# Patient Record
Sex: Male | Born: 1990 | Race: White | Hispanic: No | Marital: Married | Smoking: Former smoker
Health system: Southern US, Community
[De-identification: ages and names within clinical notes are randomized; demographics above are authoritative.]

---

## 2014-10-06 ENCOUNTER — Encounter: Payer: Self-pay | Admitting: *Deleted

## 2014-10-06 ENCOUNTER — Emergency Department (INDEPENDENT_AMBULATORY_CARE_PROVIDER_SITE_OTHER)
Admission: EM | Admit: 2014-10-06 | Discharge: 2014-10-06 | Disposition: A | Discharge status: Home / Self Care | Payer: Worker's Compensation | Source: Home / Self Care | Attending: Family Medicine | Admitting: Family Medicine

## 2014-10-06 DIAGNOSIS — S39012A Strain of muscle, fascia and tendon of lower back, initial encounter: Secondary | ICD-10-CM | POA: Diagnosis not present

## 2014-10-06 MED ORDER — METHOCARBAMOL 500 MG PO TABS: 500.0000 mg | ORAL_TABLET | Freq: Two times a day (BID) | ORAL | Status: DC

## 2014-10-06 MED ORDER — MELOXICAM 7.5 MG PO TABS: 7.5000 mg | ORAL_TABLET | Freq: Every day | ORAL | Status: DC

## 2014-10-06 NOTE — ED Provider Notes (Signed)
CSN: [id number]     Arrival date & time 10/06/14  0920 History   First MD Initiated Contact with Patient 10/06/14 [phone]     Chief Complaint  Patient presents with  . Back Pain   (Consider location/radiation/quality/duration/timing/severity/associated sxs/prior Treatment) HPI  Pt is a 24yo male presenting to [facility] with c/o bilateral lower back pain that started yesterday after pt bent down to pick up a case of Gatorade. Pt states he felt a pinch in his lower back. Pain is aching and sharp on occasion with certain movements.  Pain is 8/10 at worst. No relief with ibuprofen. Denies change in bowel or bladder habits. Denies numbness or tingling in arms or legs. Denies hx of prior back surgeries or injuries. Denies any other injuries.  History reviewed. No pertinent past medical history. History reviewed. No pertinent past surgical history. History reviewed. No pertinent family history. Social History  Substance Use Topics  . Smoking status: Former Games developer  . Smokeless tobacco: Current User  . Alcohol Use: Yes    Review of Systems  Constitutional: Negative for fever and chills.  Respiratory: Negative for cough and shortness of breath.   Cardiovascular: Negative for chest pain and palpitations.  Gastrointestinal: Negative for nausea, vomiting, abdominal pain and diarrhea.  Musculoskeletal: Positive for myalgias and back pain. Negative for joint swelling, arthralgias, gait problem, neck pain and neck stiffness.  Skin: Negative for color change and wound.  Neurological: Negative for weakness and numbness.     Allergies  Review of patient's allergies indicates no known allergies.  Home Medications   Prior to Admission medications   Medication Sig Start Date End Date Taking? Authorizing Provider  meloxicam (MOBIC) 7.5 MG tablet Take 1 tablet (7.5 mg total) by mouth daily. 10/06/14   Junius Finner, PA-C  methocarbamol (ROBAXIN) 500 MG tablet Take 1 tablet (500 mg total) by mouth 2 (two) times  daily. 10/06/14   Junius Finner, PA-C   Meds Ordered and Administered this Visit  Medications - No data to display  BP 128/74 mmHg  Pulse 72  Temp(Src) 97.6 F (36.4 C) (Oral)  Resp 16  Ht 5\' 10"  (1.778 m)  Wt 203 lb (92.08 kg)  BMI 29.13 kg/m2  SpO2 99% No data found.   Physical Exam  Constitutional: He is oriented to person, place, and time. He appears well-developed and well-nourished.  HENT:  Head: Normocephalic and atraumatic.  Eyes: EOM are normal.  Neck: Normal range of motion.  Cardiovascular: Normal rate.   Pulmonary/Chest: Effort normal.  Musculoskeletal: Normal range of motion. He exhibits tenderness. He exhibits no edema.  Mild tenderness over SI joint bilaterally. Increased pain with full rotation and flexion at the hip.  FROM upper and lower extremities bilaterally with 5/5 strength. Negative straight leg raise Normal gait.  Neurological: He is alert and oriented to person, place, and time. He has normal strength. No sensory deficit. Gait normal.  Reflex Scores:      Patellar reflexes are 2+ on the right side and 2+ on the left side. Skin: Skin is warm and dry.  Psychiatric: He has a normal mood and affect. His behavior is normal.  Nursing note and vitals reviewed.    ED Course  Procedures (including critical care time)  Labs Review Labs Reviewed - No data to display  Imaging Review No results found.     MDM   1. Low back strain, initial encounter     Pt c/o lower back pain after bending down to lift a  pack of Gatorade yesterday. No red flag symptoms. Pain reproducible with palpation.  Do not believe imaging needed at this time. Not concerned for emergent process taking place. Will tx symptomatically as needed for pain.   Worker's Comp Information   Return To Work: 10/07/14- light duty, return to full duty by 10/11/14 if cleared by employee health   Work Restrictions: no heavy lifting >25, no bending, twisting, kneeling, climbing.   Referral (if  indicated):  MAKE FOLLOW-UP APPOINTMENT: 10/11/14 employee health  [facility]  At [facility] [phone] [address]  [place], [place] [address]     Junius Finner, [place] 10/06/14 1058

## 2014-10-06 NOTE — ED Notes (Signed)
Frederick Wood reports " I bent down to pick up a case of Gatorade and felt a pinch in my lower back" yesterday. Took IBF and applied heat last night. Pain is worse this morning.

## 2014-10-11 ENCOUNTER — Emergency Department (INDEPENDENT_AMBULATORY_CARE_PROVIDER_SITE_OTHER)
Admission: EM | Admit: 2014-10-11 | Discharge: 2014-10-11 | Disposition: A | Discharge status: Home / Self Care | Payer: Worker's Compensation | Source: Home / Self Care | Attending: Family Medicine | Admitting: Family Medicine

## 2014-10-11 ENCOUNTER — Encounter: Payer: Self-pay | Admitting: Emergency Medicine

## 2014-10-11 DIAGNOSIS — S39012D Strain of muscle, fascia and tendon of lower back, subsequent encounter: Secondary | ICD-10-CM

## 2014-10-11 NOTE — ED Notes (Signed)
Pt here for f/u for low back pain, work related injury from 10/06/14.  Pt is no better, still having low back pain.

## 2014-10-11 NOTE — ED Provider Notes (Signed)
CSN: [id number]     Arrival date & time 10/11/14  0915 History   First MD Initiated Contact with Patient 10/11/14 0920     No chief complaint on file.  (Consider location/radiation/quality/duration/timing/severity/associated sxs/prior Treatment) HPI Pt presenting to [facility] for recheck of lower back pain that started on 10/06/14 while at work lifting a case of Gatorade. Pt states he has had minimal improvement.  Pain is now on Right lower side rather than all the way across his back. Pain is aching and sore, 5/10 at worst. Worse with certain movements and palpation. Denies fever, chills, n/v/d. Denies pain radiating into legs or arms. No change in bowel or bladder habits. He has been taking meloxicam and robaxin with moderate temporary relief.  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  History reviewed. No pertinent past medical history. History reviewed. No pertinent past surgical history. No family history on file. Social History  Substance Use Topics  . Smoking status: Former Games developer  . Smokeless tobacco: Current User  . Alcohol Use: Yes    Review of Systems  Constitutional: Negative for fever and chills.  Genitourinary: Negative for dysuria, hematuria and flank pain.  Musculoskeletal: Positive for back pain ( Lower). Negative for myalgias, joint swelling, arthralgias and gait problem.  Skin: Negative for rash.  Neurological: Negative for weakness and numbness.    Allergies  Review of patient's allergies indicates no known allergies.  Home Medications   Prior to Admission medications   Medication Sig Start Date End Date Taking? Authorizing Provider  meloxicam (MOBIC) 7.5 MG tablet Take 1 tablet (7.5 mg total) by mouth daily. 10/06/14   Junius Finner, PA-C  methocarbamol (ROBAXIN) 500 MG tablet Take 1 tablet (500 mg total) by mouth 2 (two) times daily. 10/06/14   Junius Finner, PA-C   Meds Ordered and Administered this Visit  Medications - No data to display  BP 127/73 mmHg  Pulse 77  Temp(Src) 98.3 F (36.8 C) (Oral)  Ht  (1.778 m)  Wt 203 lb (92.08 kg)  BMI 29.13 kg/m2  SpO2 96% No data found.   Physical Exam  Constitutional: He is oriented to person, place, and time. He appears well-developed and well-nourished.  HENT:  Head: Normocephalic and atraumatic.  Eyes: EOM are normal.  Neck: Normal range of motion. Neck supple.  No midline bone tenderness, no crepitus or step-offs.    Cardiovascular: Normal rate.   Pulmonary/Chest: Effort normal.  Musculoskeletal: Normal range of motion. He exhibits tenderness.  No midline spinal tenderness. Tenderness to Right lumbar muscles. Increased pain with squatting, bending at wait and rotation at waist.  Negative straight leg raise 5/5 strength in upper and lower extremities bilaterally   Neurological: He is alert and oriented to person, place, and time.  Normal gait  Skin: Skin is warm and dry.  Psychiatric: He has a normal mood and affect. His behavior is normal.  Nursing note and vitals reviewed.   ED Course  Procedures (including critical care time)  Labs Review Labs Reviewed - No data to display  Imaging Review No results found.     MDM   1. Low back strain, subsequent encounter     Pt presenting back to [facility] for recheck of lower back strain that occurred on 10/06/14 at work. No new symptoms. Symptoms have slightly improved. No red flag symptoms.  Worker's Comp Information   Return To Work: 10/12/14, light duty   Work Restrictions: no lifting >25lbs, limited walking, standing and bending Allow pt to ice, rest, and gentle stretching as tolerated  Referral (if indicated):  MAKE FOLLOW-UP APPOINTMENT: Employee Health 10/14/14 if not improving  [facility]  At [facility] [phone] [address]  [place], [place] [address]      Junius Finner, PA-C 10/11/14 1018

## 2014-10-29 ENCOUNTER — Emergency Department (INDEPENDENT_AMBULATORY_CARE_PROVIDER_SITE_OTHER)
Admission: EM | Admit: 2014-10-29 | Discharge: 2014-10-29 | Disposition: A | Discharge status: Home / Self Care | Payer: Worker's Compensation | Source: Home / Self Care | Attending: Family Medicine | Admitting: Family Medicine

## 2014-10-29 ENCOUNTER — Encounter: Payer: Self-pay | Admitting: *Deleted

## 2014-10-29 DIAGNOSIS — M545 Low back pain, unspecified: Secondary | ICD-10-CM

## 2014-10-29 NOTE — ED Notes (Signed)
Frederick Wood is here today for W/C follow- up from his initial visit on 10/06/2014. Denies pain.

## 2014-10-29 NOTE — ED Provider Notes (Signed)
CSN: [id number]     Arrival date & time 10/29/14  1405 History   First MD Initiated Contact with Patient 10/29/14 1415     Chief Complaint  Patient presents with  . Back Pain   (Consider location/radiation/quality/duration/timing/severity/associated sxs/prior Treatment) HPI Pt is a 24yo male presenting to [facility] for recheck of lower back injury while at work on 10/06/14.  Pt states he is ready to go back to work without restrictions.  He has been alternating ice and heat as well as performing home exercises for back pain. Denies back pain at this time. Denies pain, numbness or weakness in arms or legs.  History reviewed. No pertinent past medical history. History reviewed. No pertinent past surgical history. History reviewed. No pertinent family history. Social History  Substance Use Topics  . Smoking status: Former Games developer  . Smokeless tobacco: Current User  . Alcohol Use: Yes    Review of Systems  Musculoskeletal: Negative for myalgias, arthralgias, neck pain and neck stiffness.  Skin: Negative for color change and wound.  Neurological: Negative for weakness and numbness.    Allergies  Review of patient's allergies indicates no known allergies.  Home Medications   Prior to Admission medications   Not on File   Meds Ordered and Administered this Visit  Medications - No data to display  BP 129/79 mmHg  Pulse 84  Temp(Src) 98.5 F (36.9 C) (Oral)  Resp 16  Ht  (1.778 m)  Wt 205 lb (92.987 kg)  BMI 29.41 kg/m2  SpO2 97% No data found.   Physical Exam  Constitutional: He is oriented to person, place, and time. He appears well-developed and well-nourished.  HENT:  Head: Normocephalic and atraumatic.  Eyes: EOM are normal.  Neck: Normal range of motion.  Cardiovascular: Normal rate.   Pulmonary/Chest: Effort normal.  Musculoskeletal: Normal range of motion. He exhibits no edema or tenderness.  No midline spinal tenderness. FROM upper and lower extremities with  5/5 strength bilaterally. Able to bend and rotate at waist w/o pain Able to squat without difficulty  Neurological: He is alert and oriented to person, place, and time.  Normal gait. Normal sensations in all extremities bilaterally   Skin: Skin is warm and dry.  Psychiatric: He has a normal mood and affect. His behavior is normal.  Nursing note and vitals reviewed.   ED Course  Procedures (including critical care time)  Labs Review Labs Reviewed - No data to display  Imaging Review No results found.    MDM   1. Bilateral low back pain without sciatica    Pt cleared to return to full duty at work, no restrictions.  Follow up with primary care or employee health as needed.    Junius Finner, PA-C 10/29/14 1431

## 2014-10-29 NOTE — Discharge Instructions (Signed)
Follow up with primary care and employee health as needed

## 2014-11-15 ENCOUNTER — Ambulatory Visit (INDEPENDENT_AMBULATORY_CARE_PROVIDER_SITE_OTHER): Payer: Self-pay

## 2014-11-15 ENCOUNTER — Other Ambulatory Visit: Payer: Self-pay | Admitting: Emergency Medicine

## 2014-11-15 DIAGNOSIS — R52 Pain, unspecified: Secondary | ICD-10-CM

## 2014-11-15 DIAGNOSIS — M25571 Pain in right ankle and joints of right foot: Secondary | ICD-10-CM

## 2015-06-29 ENCOUNTER — Emergency Department (INDEPENDENT_AMBULATORY_CARE_PROVIDER_SITE_OTHER)
Admission: EM | Admit: 2015-06-29 | Discharge: 2015-06-29 | Disposition: A | Discharge status: Home / Self Care | Payer: Worker's Compensation | Source: Home / Self Care | Attending: Family Medicine | Admitting: Family Medicine

## 2015-06-29 ENCOUNTER — Encounter: Payer: Self-pay | Admitting: *Deleted

## 2015-06-29 DIAGNOSIS — S39012A Strain of muscle, fascia and tendon of lower back, initial encounter: Secondary | ICD-10-CM

## 2015-06-29 DIAGNOSIS — M545 Low back pain, unspecified: Secondary | ICD-10-CM

## 2015-06-29 MED ORDER — PREDNISONE 20 MG PO TABS: ORAL_TABLET | ORAL | Status: DC

## 2015-06-29 MED ORDER — MELOXICAM 7.5 MG PO TABS: 7.5000 mg | ORAL_TABLET | Freq: Every day | ORAL | Status: DC

## 2015-06-29 MED ORDER — CYCLOBENZAPRINE HCL 10 MG PO TABS: 10.0000 mg | ORAL_TABLET | Freq: Two times a day (BID) | ORAL | Status: DC | PRN

## 2015-06-29 NOTE — Discharge Instructions (Signed)
Flexeril is a muscle relaxer and may cause drowsiness. Do not drink alcohol, drive, or operate heavy machinery while taking.  Meloxicam (Mobic) is an antiinflammatory to help with pain and inflammation.  Do not take ibuprofen, Advil, Aleve, or any other medications that contain NSAIDs while taking meloxicam as this may cause stomach upset or even ulcers if taken in large amounts for an extended period of time.    Back Injury Prevention Back injuries can be very painful. They can also be difficult to heal. After having one back injury, you are more likely to injure your back again. It is important to learn how to avoid injuring or re-injuring your back. The following tips can help you to prevent a back injury. WHAT SHOULD I KNOW ABOUT PHYSICAL FITNESS? 1. Exercise for 30 minutes per day on most days of the week or as directed by your health care provider. Make sure to: 1. Do aerobic exercises, such as walking, jogging, biking, or swimming. 2. Do exercises that increase balance and strength, such as tai chi and yoga. These can decrease your risk of falling and injuring your back. 3. Do stretching exercises to help with flexibility. 4. Try to develop strong abdominal muscles. Your abdominal muscles provide a lot of the support that is needed by your back. 2. Maintain a healthy weight. This helps to decrease your risk of a back injury. WHAT SHOULD I KNOW ABOUT MY DIET? 1. Talk with your health care provider about your overall diet. Take supplements and vitamins only as directed by your health care provider. 2. Talk with your health care provider about how much calcium and vitamin D you need each day. These nutrients help to prevent weakening of the bones (osteoporosis). Osteoporosis can cause broken (fractured) bones, which lead to back pain. 3. Include good sources of calcium in your diet, such as dairy products, green leafy vegetables, and products that have had calcium added to them  (fortified). 4. Include good sources of vitamin D in your diet, such as milk and foods that are fortified with vitamin D. WHAT SHOULD I KNOW ABOUT MY POSTURE? 1. Sit up straight and stand up straight. Avoid leaning forward when you sit or hunching over when you stand. 2. Choose chairs that have good low-back (lumbar) support. 3. If you work at a desk, sit close to it so you do not need to lean over. Keep your chin tucked in. Keep your neck drawn back, and keep your elbows bent at a right angle. Your arms should look like the letter "L." 4. Sit high and close to the steering wheel when you drive. Add a lumbar support to your car seat, if needed. 5. Avoid sitting or standing in one position for very long. Take breaks to get up, stretch, and walk around at least one time every hour. Take breaks every hour if you are driving for long periods of time. 6. Sleep on your side with your knees slightly bent, or sleep on your back with a pillow under your knees. Do not lie on the front of your body to sleep. WHAT SHOULD I KNOW ABOUT LIFTING, TWISTING, AND REACHING? Lifting and Heavy Lifting 1. Avoid heavy lifting, especially repetitive heavy lifting. If you must do heavy lifting: 1. Stretch before lifting. 2. Work slowly. 3. Rest between lifts. 4. Use a tool such as a cart or a dolly to move objects if one is available. 5. Make several small trips instead of carrying one heavy load. 6. Ask for  help when you need it, especially when moving big objects. 2. Follow these steps when lifting: 1. Stand with your feet shoulder-width apart. 2. Get as close to the object as you can. Do not try to pick up a heavy object that is far from your body. 3. Use handles or lifting straps if they are available. 4. Bend at your knees. Squat down, but keep your heels off the floor. 5. Keep your shoulders pulled back, your chin tucked in, and your back straight. 6. Lift the object slowly while you tighten the muscles in your  legs, abdomen, and buttocks. Keep the object as close to the center of your body as possible. 3. Follow these steps when putting down a heavy load: 1. Stand with your feet shoulder-width apart. 2. Lower the object slowly while you tighten the muscles in your legs, abdomen, and buttocks. Keep the object as close to the center of your body as possible. 3. Keep your shoulders pulled back, your chin tucked in, and your back straight. 4. Bend at your knees. Squat down, but keep your heels off the floor. 5. Use handles or lifting straps if they are available. Twisting and Reaching 1. Avoid lifting heavy objects above your waist. 2. Do not twist at your waist while you are lifting or carrying a load. If you need to turn, move your feet. 3. Do not bend over without bending at your knees. 4. Avoid reaching over your head, across a table, or for an object on a high surface. WHAT ARE SOME OTHER TIPS? 1. Avoid wet floors and icy ground. Keep sidewalks clear of ice to prevent falls. 2. Do not sleep on a mattress that is too soft or too hard. 3. Keep items that are used frequently within easy reach. 4. Put heavier objects on shelves at waist level, and put lighter objects on lower or higher shelves. 5. Find ways to decrease your stress, such as exercise, massage, or relaxation techniques. Stress can build up in your muscles. Tense muscles are more vulnerable to injury. 6. Talk with your health care provider if you feel anxious or depressed. These conditions can make back pain worse. 7. Wear flat heel shoes with cushioned soles. 8. Avoid sudden movements. 9. Use both shoulder straps when carrying a backpack. 10. Do not use any tobacco products, including cigarettes, chewing tobacco, or electronic cigarettes. If you need help quitting, ask your health care provider.   This information is not intended to replace advice given to you by your health care provider. Make sure you discuss any questions you have  with your health care provider.   Document Released: 03/01/2004 Document Revised: 06/08/2014 Document Reviewed: 01/26/2014 Elsevier Interactive Patient Education 2016 [employer]  Back Pain, Adult Back pain is very common. The pain often gets better over time. The cause of back pain is usually not dangerous. Most people can learn to manage their back pain on their own.  HOME CARE  Watch your back pain for any changes. The following actions may help to lessen any pain you are feeling: 3. Stay active. Start with short walks on flat ground if you can. Try to walk farther each day. 4. Exercise regularly as told by your doctor. Exercise helps your back heal faster. It also helps avoid future injury by keeping your muscles strong and flexible. 5. Do not sit, drive, or stand in one place for more than 30 minutes. 6. Do not stay in bed. Resting more than 1-2 days can slow  down your recovery. 7. Be careful when you bend or lift an object. Use good form when lifting: 1. Bend at your knees. 2. Keep the object close to your body. 3. Do not twist. 8. Sleep on a firm mattress. Lie on your side, and bend your knees. If you lie on your back, put a pillow under your knees. 9. Take medicines only as told by your doctor. 10. Put ice on the injured area. 1. Put ice in a plastic bag. 2. Place a towel between your skin and the bag. 3. Leave the ice on for 20 minutes, 2-3 times a day for the first 2-3 days. After that, you can switch between ice and heat packs. 11. Avoid feeling anxious or stressed. Find good ways to deal with stress, such as exercise. 12. Maintain a healthy weight. Extra weight puts stress on your back. GET HELP IF:  5. You have pain that does not go away with rest or medicine. 6. You have worsening pain that goes down into your legs or buttocks. 7. You have pain that does not get better in one week. 8. You have pain at night. 9. You lose weight. 10. You have a fever or chills. GET HELP  RIGHT AWAY IF:  7. You cannot control when you poop (bowel movement) or pee (urinate). 8. Your arms or legs feel weak. 9. Your arms or legs lose feeling (numbness). 10. You feel sick to your stomach (nauseous) or throw up (vomit). 11. You have belly (abdominal) pain. 12. You feel like you may pass out (faint).   This information is not intended to replace advice given to you by your health care provider. Make sure you discuss any questions you have with your health care provider.   Document Released: 07/11/2007 Document Revised: 02/12/2014 Document Reviewed: 05/26/2013 Elsevier Interactive Patient Education 2016 [employer].  Back Exercises If you have pain in your back, do these exercises 2-3 times each day or as told by your doctor. When the pain goes away, do the exercises once each day, but repeat the steps more times for each exercise (do more repetitions). If you do not have pain in your back, do these exercises once each day or as told by your doctor. EXERCISES Single Knee to Chest Do these steps 3-5 times in a row for each leg: 13. Lie on your back on a firm bed or the floor with your legs stretched out. 24. Bring one knee to your chest. 15. Hold your knee to your chest by grabbing your knee or thigh. 16. Pull on your knee until you feel a gentle stretch in your lower back. 17. Keep doing the stretch for 10-30 seconds. 18. Slowly let go of your leg and straighten it. Pelvic Tilt Do these steps 5-10 times in a row: 11. Lie on your back on a firm bed or the floor with your legs stretched out. 12. Bend your knees so they point up to the ceiling. Your feet should be flat on the floor. 13. Tighten your lower belly (abdomen) muscles to press your lower back against the floor. This will make your tailbone point up to the ceiling instead of pointing down to your feet or the floor. 14. Stay in this position for 5-10 seconds while you gently tighten your muscles and breathe  evenly. Cat-Cow Do these steps until your lower back bends more easily: 13. Get on your hands and knees on a firm surface. Keep your hands under your shoulders, and keep  your knees under your hips. You may put padding under your knees. 14. Let your head hang down, and make your tailbone point down to the floor so your lower back is round like the back of a cat. 15. Stay in this position for 5 seconds. 16. Slowly lift your head and make your tailbone point up to the ceiling so your back hangs low (sags) like the back of a cow. 17. Stay in this position for 5 seconds. Press-Ups Do these steps 5-10 times in a row: 4. Lie on your belly (face-down) on the floor. 5. Place your hands near your head, about shoulder-width apart. 6. While you keep your back relaxed and keep your hips on the floor, slowly straighten your arms to raise the top half of your body and lift your shoulders. Do not use your back muscles. To make yourself more comfortable, you may change where you place your hands. 7. Stay in this position for 5 seconds. 8. Slowly return to lying flat on the floor. Bridges Do these steps 10 times in a row: 5. Lie on your back on a firm surface. 6. Bend your knees so they point up to the ceiling. Your feet should be flat on the floor. 7. Tighten your butt muscles and lift your butt off of the floor until your waist is almost as high as your knees. If you do not feel the muscles working in your butt and the back of your thighs, slide your feet 1-2 inches farther away from your butt. 8. Stay in this position for 3-5 seconds. 9. Slowly lower your butt to the floor, and let your butt muscles relax. If this exercise is too easy, try doing it with your arms crossed over your chest. Belly Crunches Do these steps 5-10 times in a row: 11. Lie on your back on a firm bed or the floor with your legs stretched out. 12. Bend your knees so they point up to the ceiling. Your feet should be flat on the  floor. 34. Cross your arms over your chest. 14. Tip your chin a little bit toward your chest but do not bend your neck. 58. Tighten your belly muscles and slowly raise your chest just enough to lift your shoulder blades a tiny bit off of the floor. 16. Slowly lower your chest and your head to the floor. Back Lifts Do these steps 5-10 times in a row: 1. Lie on your belly (face-down) with your arms at your sides, and rest your forehead on the floor. 2. Tighten the muscles in your legs and your butt. 3. Slowly lift your chest off of the floor while you keep your hips on the floor. Keep the back of your head in line with the curve in your back. Look at the floor while you do this. 4. Stay in this position for 3-5 seconds. 5. Slowly lower your chest and your face to the floor. GET HELP IF:  Your back pain gets a lot worse when you do an exercise.  Your back pain does not lessen 2 hours after you exercise. If you have any of these problems, stop doing the exercises. Do not do them again unless your doctor says it is okay. GET HELP RIGHT AWAY IF:  You have sudden, very bad back pain. If this happens, stop doing the exercises. Do not do them again unless your doctor says it is okay.   This information is not intended to replace advice given to you by your  health care provider. Make sure you discuss any questions you have with your health care provider.   Document Released: 02/24/2010 Document Revised: 10/13/2014 Document Reviewed: 03/18/2014 Elsevier Interactive Patient Education [employer].

## 2015-06-29 NOTE — ED Notes (Signed)
Frederick Wood is here today for work related injury. he c/o LT lower back pain x 0800 after bending over to pick up a pallet. No OTC meds.

## 2015-06-29 NOTE — ED Provider Notes (Signed)
CSN: [id number]     Arrival date & time 06/29/15  1121 History   First MD Initiated Contact with Patient 06/29/15 1132     Chief Complaint  Patient presents with  . Back Pain   (Consider location/radiation/quality/duration/timing/severity/associated sxs/prior Treatment) HPI  The pt is a 25yo male presenting to [facility] with c/o a work related back injury from earlier this morning while working at [employer] . Pt notes he bent over trying to pick up a pallet. He pulled on a broken part of the product and felt immediate pain in his left lower back. Pain is 5/10 at this time. Pain is aching and sore. Worse with certain movements. He notes he has had back issues for several years due to riding dirt bikes and working at [employer] prior to working for [employer] .  Pain is worsened more frequently over the last few months. No pain medication PTA. He does not wear a back brace while working. Denies hx of prior back or neck surgeries. Denies numbness or weakness in arms or legs. No change in bowel or bladder habits.   History reviewed. No pertinent past medical history. History reviewed. No pertinent past surgical history. History reviewed. No pertinent family history. Social History  Substance Use Topics  . Smoking status: Former Games developer  . Smokeless tobacco: Current User  . Alcohol Use: Yes    Review of Systems  Musculoskeletal: Positive for myalgias and back pain. Negative for joint swelling, arthralgias, gait problem, neck pain and neck stiffness.  Skin: Negative for color change and wound.  Neurological: Negative for weakness and numbness.    Allergies  Review of patient's allergies indicates no known allergies.  Home Medications   Prior to Admission medications   Medication Sig Start Date End Date Taking? Authorizing Provider  cyclobenzaprine (FLEXERIL) 10 MG tablet Take 1 tablet (10 mg total) by mouth 2 (two) times daily as needed for muscle spasms. 06/29/15   Junius Finner, PA-C  meloxicam (MOBIC) 7.5 MG  tablet Take 1-2 tablets (7.5-15 mg total) by mouth daily. 06/29/15   Junius Finner, PA-C  predniSONE (DELTASONE) 20 MG tablet 3 tabs po day one, then 2 po daily x 4 days 06/29/15   Junius Finner, PA-C   Meds Ordered and Administered this Visit  Medications - No data to display  BP 135/78 mmHg  Pulse 74  Temp(Src) 97.9 F (36.6 C) (Oral)  Resp 16  Ht  (1.778 m)  Wt 206 lb (93.441 kg)  BMI 29.56 kg/m2  SpO2 98% No data found.   Physical Exam  Constitutional: He is oriented to person, place, and time. He appears well-developed and well-nourished.  HENT:  Head: Normocephalic and atraumatic.  Eyes: EOM are normal.  Neck: Normal range of motion.  Cardiovascular: Normal rate, regular rhythm and normal heart sounds.   Pulmonary/Chest: Effort normal and breath sounds normal. No respiratory distress. He has no wheezes. He has no rales.  Musculoskeletal: Normal range of motion. He exhibits tenderness.  No midline spinal tenderness. Tenderness to Left lower lumbar muscles. Full ROM upper and lower extremities bilaterally. Negative straight leg raise.  Neurological: He is alert and oriented to person, place, and time.  Reflex Scores:      Patellar reflexes are 2+ on the right side and 2+ on the left side. Normal gait  Skin: Skin is warm and dry.  Psychiatric: He has a normal mood and affect. His behavior is normal.  Nursing note and vitals reviewed.   ED Course  Procedures (including  critical care time)  Labs Review Labs Reviewed - No data to display  Imaging Review No results found.    MDM   1. Low back strain, initial encounter   2. Left-sided low back pain without sciatica    Pt c/o exacerbation of Left lower back pain after bending over to pick up a pallet at work.  No red flag symptoms. Tenderness to muscle.  Pain likely due to muscle strain. Encouraged conservative treatment with light duty until Tuesday 07/05/15. Rx: Prednisone, Meloxicam, and Flexeril (only when  home/not working)  Encouraged using ice today and tomorrow, then transitioning to heating pad or warm compresses.   Home stretching exercises and pt info on preventing back injuries provided. Discussed using proper body mechanics for lifting. May consider using a back brace/belt for work.   F/u with Occupational Health Tuesday 07/05/15 as he may be able to get off light duty.  Patient verbalized understanding and agreement with treatment plan.     Junius Finnerrin O'Malley, PA-C 06/29/15 1237

## 2016-10-08 ENCOUNTER — Emergency Department (INDEPENDENT_AMBULATORY_CARE_PROVIDER_SITE_OTHER)
Admission: EM | Admit: 2016-10-08 | Discharge: 2016-10-08 | Disposition: A | Discharge status: Home / Self Care | Payer: Worker's Compensation | Source: Home / Self Care | Attending: Family Medicine | Admitting: Family Medicine

## 2016-10-08 ENCOUNTER — Emergency Department (INDEPENDENT_AMBULATORY_CARE_PROVIDER_SITE_OTHER): Payer: Worker's Compensation

## 2016-10-08 ENCOUNTER — Encounter: Payer: Self-pay | Admitting: Emergency Medicine

## 2016-10-08 DIAGNOSIS — S9001XA Contusion of right ankle, initial encounter: Secondary | ICD-10-CM | POA: Diagnosis not present

## 2016-10-08 DIAGNOSIS — W208XXA Other cause of strike by thrown, projected or falling object, initial encounter: Secondary | ICD-10-CM

## 2016-10-08 DIAGNOSIS — M25471 Effusion, right ankle: Secondary | ICD-10-CM | POA: Diagnosis not present

## 2016-10-08 DIAGNOSIS — S81811A Laceration without foreign body, right lower leg, initial encounter: Secondary | ICD-10-CM | POA: Diagnosis not present

## 2016-10-08 DIAGNOSIS — Y990 Civilian activity done for income or pay: Secondary | ICD-10-CM

## 2016-10-08 DIAGNOSIS — M25571 Pain in right ankle and joints of right foot: Secondary | ICD-10-CM

## 2016-10-08 DIAGNOSIS — T797XXA Traumatic subcutaneous emphysema, initial encounter: Secondary | ICD-10-CM

## 2016-10-08 NOTE — ED Triage Notes (Signed)
Patient was injured on the job today by a metal ramp that fell on his right ankle today at work.  There was a small abrasion and bruising around the ankle from the injury.

## 2016-10-08 NOTE — Discharge Instructions (Signed)
  You may take 500mg acetaminophen every 4-6 hours or in combination with ibuprofen 400-600mg every 6-8 hours as needed for pain and inflammation.  

## 2016-10-08 NOTE — ED Provider Notes (Signed)
[facility]    CSN: [id number] Arrival date & time: 10/08/16  1237     History   Chief Complaint Chief Complaint  Patient presents with  . Ankle Pain    HPI Antony Contrasndrew Fuente is a 26 y.o. male.   HPI Antony Contrasndrew Thissen is a 26 y.o. male presenting to UC from [employer] with a worker's comp injury, c/o Right anterior ankle pain, swelling, and wound that occurred around 7AM this morning after accidentally dropping a metal ramp onto his Right ankle. Pain is aching and sore, 5/10. He has noticed some bruising and swelling to the area. No pain medication taken PTA. Employer encouraged pt to come be evaluated.  No other injuries.  He believes last tetanus shot was about 1-2 years ago by his PCP.   History reviewed. No pertinent past medical history.  There are no active problems to display for this patient.   History reviewed. No pertinent surgical history.     Home Medications    Prior to Admission medications   Not on File    Family History No family history on file.  Social History Social History  Substance Use Topics  . Smoking status: Former Games developermoker  . Smokeless tobacco: Current User  . Alcohol use Yes     Allergies   Patient has no known allergies.   Review of Systems Review of Systems  Musculoskeletal: Positive for arthralgias, joint swelling and myalgias. Negative for gait problem.  Skin: Positive for color change and wound.  Neurological: Negative for weakness and numbness.     Physical Exam Triage Vital Signs ED Triage Vitals  Enc Vitals Group     BP 10/08/16 1321 124/80     Pulse Rate 10/08/16 1321 87     Resp --      Temp 10/08/16 1321 97.9 F (36.6 C)     Temp Source 10/08/16 1321 Oral     SpO2 10/08/16 1321 98 %     Weight 10/08/16 1321 208 lb 4 oz (94.5 kg)     Height 10/08/16 1321 5\' 10"  (1.778 m)     Head Circumference --      Peak Flow --      Pain Score 10/08/16 1322 5     Pain Loc --      Pain Edu? --      Excl. in GC? --     No data found.   Updated Vital Signs BP 124/80 (BP Location: Left Arm)   Pulse 87   Temp 97.9 F (36.6 C) (Oral)   Ht 5\' 10"  (1.778 m)   Wt 208 lb 4 oz (94.5 kg)   SpO2 98%   BMI 29.88 kg/m   Visual Acuity Right Eye Distance:   Left Eye Distance:   Bilateral Distance:    Right Eye Near:   Left Eye Near:    Bilateral Near:     Physical Exam  Constitutional: He is oriented to person, place, and time. He appears well-developed and well-nourished.  HENT:  Head: Normocephalic and atraumatic.  Eyes: EOM are normal.  Neck: Normal range of motion.  Cardiovascular: Normal rate.   Pulmonary/Chest: Effort normal.  Musculoskeletal: Normal range of motion. He exhibits edema and tenderness.       Legs: Right lower leg, anterior ankle: mild edema, tender. Full ROM. Calf is soft, non-tender. Right foot: no edema, mild tenderness to proximal dorsal aspect.   Neurological: He is alert and oriented to person, place, and time.  Skin: Skin  is warm and dry.  Right anterior lower leg/anterior ankle: faint ecchymosis with 2x4cm skin tear. Scant red blood. No foreign bodies seen or palpated. Small 1cm skin avulsion distal to larger wound.     Psychiatric: He has a normal mood and affect. His behavior is normal.  Nursing note and vitals reviewed.    UC Treatments / Results  Labs (all labs ordered are listed, but only abnormal results are displayed) Labs Reviewed - No data to display  EKG  EKG Interpretation None       Radiology Dg Ankle Complete Right  Result Date: 10/08/2016 CLINICAL DATA:  Right ankle pain and swelling after injury today EXAM: RIGHT ANKLE - COMPLETE 3+ VIEW COMPARISON:  None. FINDINGS: Soft tissue swelling with mild subcutaneous emphysema medial to the distal right tibia. No fracture or subluxation. No suspicious focal osseous lesion. No cortical erosions or periosteal reaction. No radiopaque foreign body. IMPRESSION: Soft tissue swelling with mild subcutaneous  emphysema medial to the distal right tibia. No fracture or subluxation in the right ankle. No radiopaque foreign body. Electronically Signed   By: Delbert Phenix M.D.   On: 10/08/2016 13:37    Procedures Procedures (including critical care time)  Medications Ordered in UC Medications - No data to display   Initial Impression / Assessment and Plan / UC Course  I have reviewed the triage vital signs and the nursing notes.  Pertinent labs & imaging results that were available during my care of the patient were reviewed by me and considered in my medical decision making (see chart for details).     Wound cleaned with saline. Bacitracin and bandage applied.   Final Clinical Impressions(s) / UC Diagnoses   Final diagnoses:  Pain and swelling of right ankle  Work related injury  Skin tear of right lower leg without complication, initial encounter  Contusion of right ankle, initial encounter   Pt may return to work without restrictions. Home care instructions for wound care and contusion provided. F/u with Employee Health if needed.   New Prescriptions There are no discharge medications for this patient.    Controlled Substance Prescriptions [place] Controlled Substance Registry consulted? Not Applicable   Rolla Plate 10/08/16 1404

## 2017-01-31 ENCOUNTER — Ambulatory Visit (INDEPENDENT_AMBULATORY_CARE_PROVIDER_SITE_OTHER): Payer: Self-pay

## 2017-01-31 ENCOUNTER — Other Ambulatory Visit: Payer: Self-pay | Admitting: Gerontology

## 2017-01-31 DIAGNOSIS — S9031XA Contusion of right foot, initial encounter: Secondary | ICD-10-CM

## 2017-01-31 DIAGNOSIS — M79671 Pain in right foot: Secondary | ICD-10-CM

## 2017-03-20 ENCOUNTER — Other Ambulatory Visit: Payer: Self-pay | Admitting: Gerontology

## 2017-03-20 ENCOUNTER — Ambulatory Visit (INDEPENDENT_AMBULATORY_CARE_PROVIDER_SITE_OTHER): Payer: Self-pay

## 2017-03-20 DIAGNOSIS — R0602 Shortness of breath: Secondary | ICD-10-CM

## 2018-10-20 IMAGING — DX DG CHEST 2V
2 series · 2 of 2 positions shown · non-contrast
Comparison: None.

CLINICAL DATA: Shortness of breath for 1 week.  Smoker.

EXAM:
CHEST  2 VIEW

[chest pa]
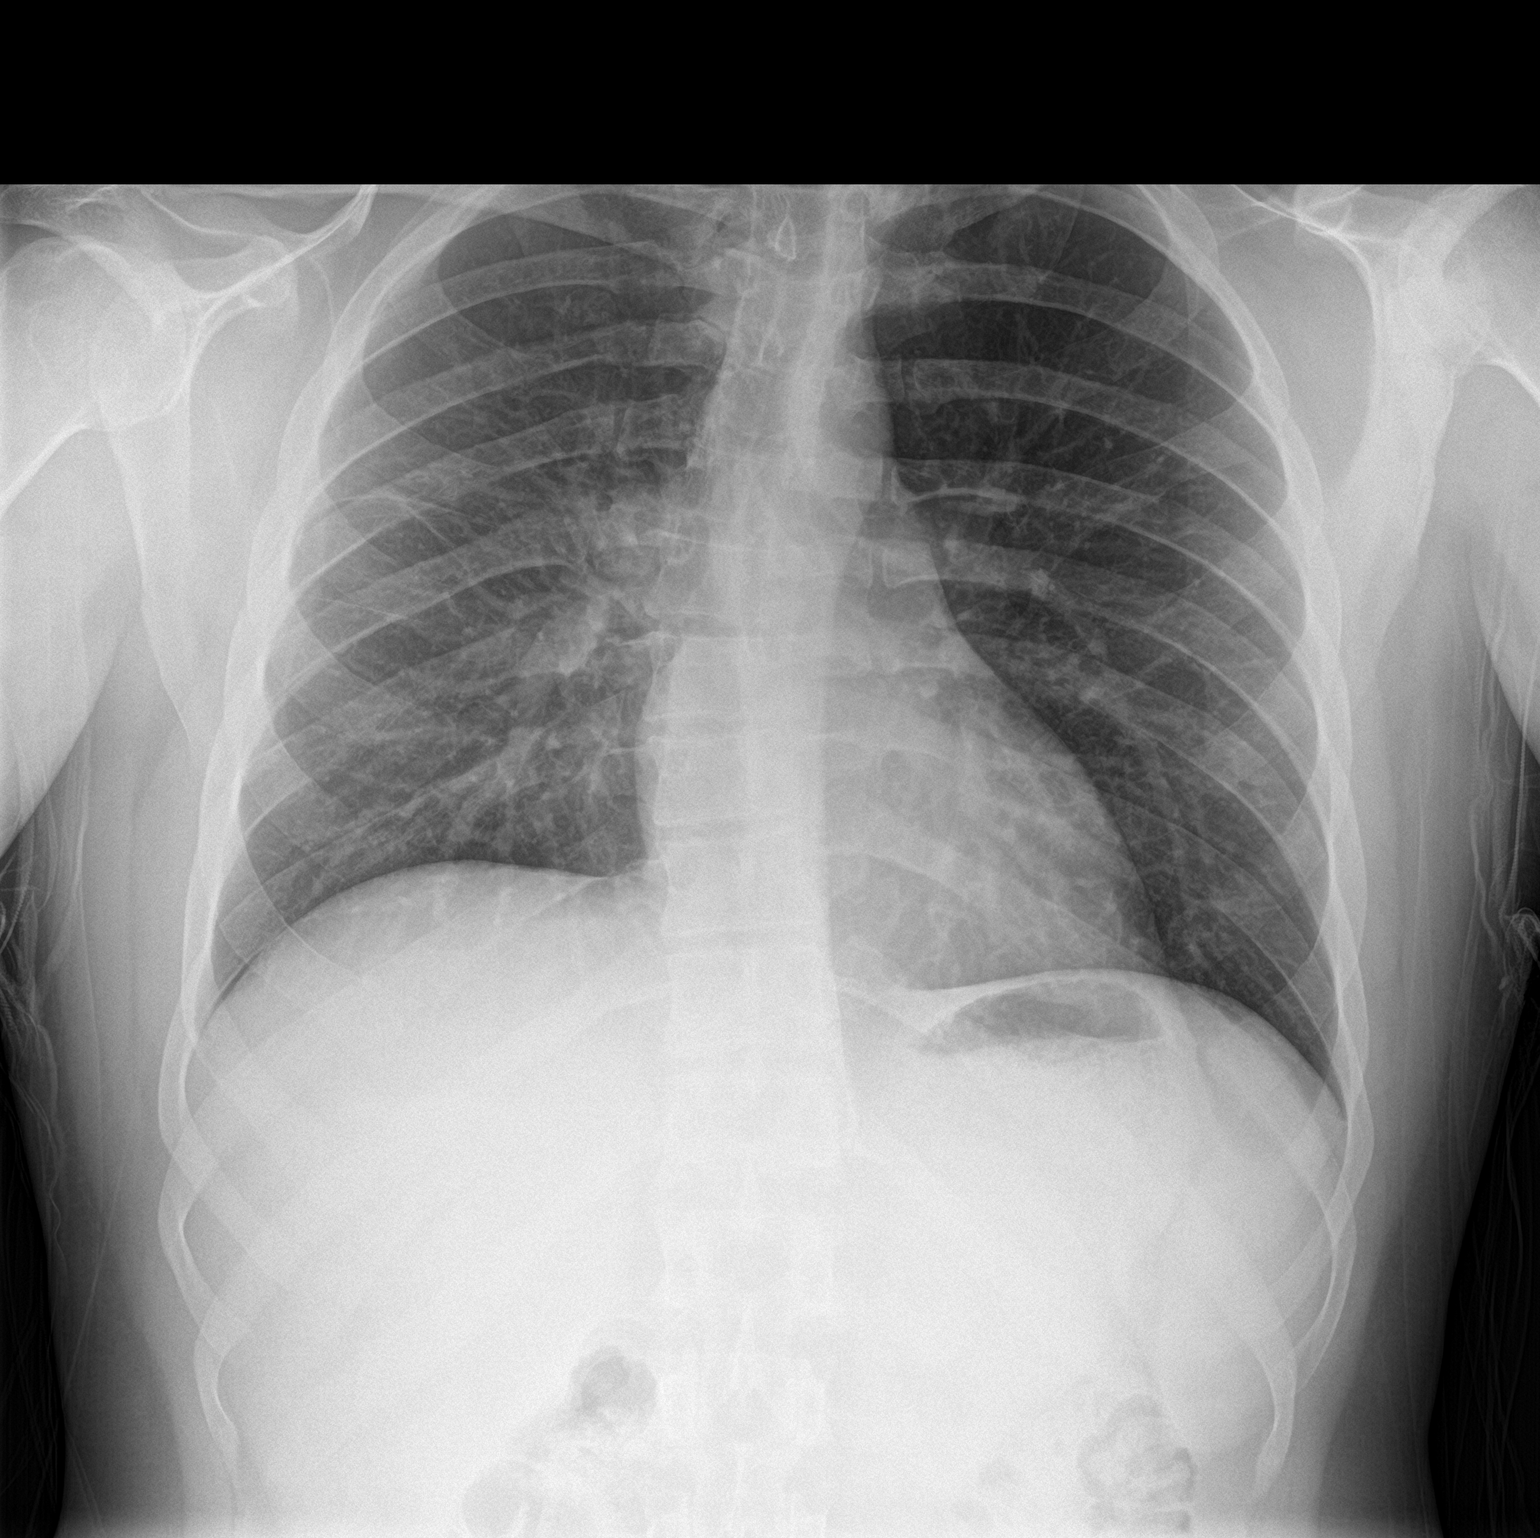

[chest lat]
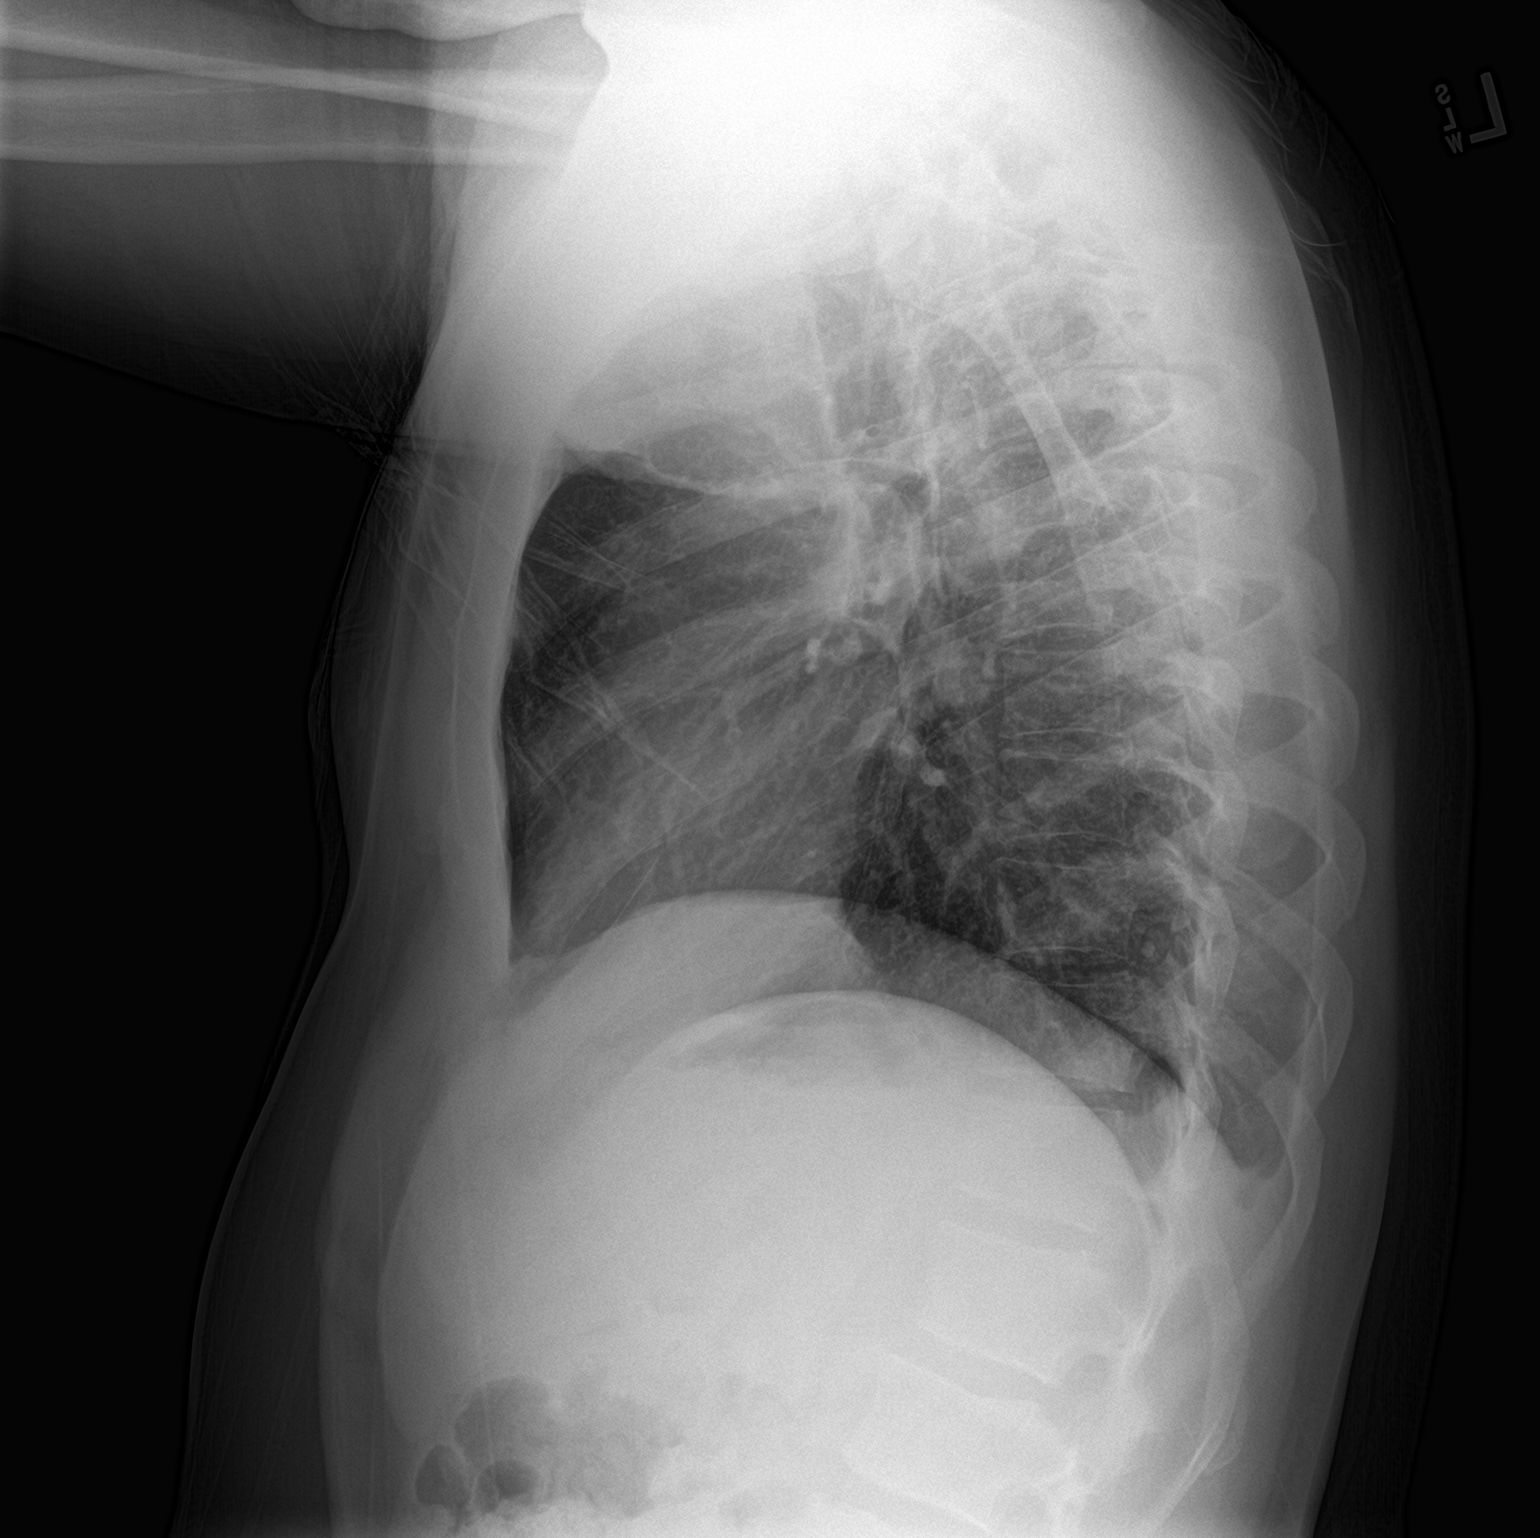

[2 of 2 positions shown; findings below may reference images not displayed]

FINDINGS: Clothing artifact overlies the patient, especially anteriorly on the
lateral view. The heart size and mediastinal contours are normal.
There is mild right perihilar atelectasis or scarring. The lungs are
otherwise clear. There is no pleural effusion or pneumothorax. There
is a convex right thoracic scoliosis. No acute osseous findings.
IMPRESSION: No acute cardiopulmonary process.
